# Patient Record
Sex: Male | Born: 1960 | Race: Black or African American | Hispanic: No | Marital: Single | Smoking: Never smoker
Health system: Southern US, Community
[De-identification: ages and names within clinical notes are randomized; demographics above are authoritative.]

## PROBLEM LIST (undated history)

## (undated) DIAGNOSIS — M549 Dorsalgia, unspecified: Secondary | ICD-10-CM

## (undated) DIAGNOSIS — I10 Essential (primary) hypertension: Secondary | ICD-10-CM

## (undated) DIAGNOSIS — N400 Enlarged prostate without lower urinary tract symptoms: Secondary | ICD-10-CM

---

## 2016-10-24 ENCOUNTER — Emergency Department (HOSPITAL_COMMUNITY)
Admission: EM | Admit: 2016-10-24 | Discharge: 2016-10-25 | Disposition: A | Discharge status: Home / Self Care | Payer: Self-pay | Attending: Emergency Medicine | Admitting: Emergency Medicine

## 2016-10-24 ENCOUNTER — Emergency Department (HOSPITAL_COMMUNITY): Payer: Self-pay

## 2016-10-24 ENCOUNTER — Encounter (HOSPITAL_COMMUNITY): Payer: Self-pay

## 2016-10-24 DIAGNOSIS — I10 Essential (primary) hypertension: Secondary | ICD-10-CM | POA: Insufficient documentation

## 2016-10-24 DIAGNOSIS — F4329 Adjustment disorder with other symptoms: Secondary | ICD-10-CM

## 2016-10-24 DIAGNOSIS — F1120 Opioid dependence, uncomplicated: Secondary | ICD-10-CM | POA: Diagnosis present

## 2016-10-24 DIAGNOSIS — Z79899 Other long term (current) drug therapy: Secondary | ICD-10-CM | POA: Insufficient documentation

## 2016-10-24 DIAGNOSIS — F29 Unspecified psychosis not due to a substance or known physiological condition: Secondary | ICD-10-CM

## 2016-10-24 HISTORY — DX: Essential (primary) hypertension: I10

## 2016-10-24 HISTORY — DX: Dorsalgia, unspecified: M54.9

## 2016-10-24 HISTORY — DX: Benign prostatic hyperplasia without lower urinary tract symptoms: N40.0

## 2016-10-24 LAB — CBC WITH DIFFERENTIAL/PLATELET
BASOS ABS: 0 10*3/uL (ref 0.0–0.1)
BASOS PCT: 1 %
EOS ABS: 0.2 10*3/uL (ref 0.0–0.7)
EOS PCT: 3 %
HCT: 36 % — ABNORMAL LOW (ref 39.0–52.0)
Hemoglobin: 11.7 g/dL — ABNORMAL LOW (ref 13.0–17.0)
LYMPHS PCT: 34 %
Lymphs Abs: 2.3 10*3/uL (ref 0.7–4.0)
MCH: 26.8 pg (ref 26.0–34.0)
MCHC: 32.5 g/dL (ref 30.0–36.0)
MCV: 82.6 fL (ref 78.0–100.0)
Monocytes Absolute: 0.6 10*3/uL (ref 0.1–1.0)
Monocytes Relative: 9 %
Neutro Abs: 3.6 10*3/uL (ref 1.7–7.7)
Neutrophils Relative %: 53 %
PLATELETS: 202 10*3/uL (ref 150–400)
RBC: 4.36 MIL/uL (ref 4.22–5.81)
RDW: 13.7 % (ref 11.5–15.5)
WBC: 6.7 10*3/uL (ref 4.0–10.5)

## 2016-10-24 LAB — RAPID URINE DRUG SCREEN, HOSP PERFORMED
Amphetamines: NOT DETECTED
BARBITURATES: NOT DETECTED
Benzodiazepines: POSITIVE — AB
Cocaine: NOT DETECTED
OPIATES: POSITIVE — AB
Tetrahydrocannabinol: NOT DETECTED

## 2016-10-24 LAB — URINALYSIS, ROUTINE W REFLEX MICROSCOPIC
Bilirubin Urine: NEGATIVE
Glucose, UA: NEGATIVE mg/dL
HGB URINE DIPSTICK: NEGATIVE
KETONES UR: NEGATIVE mg/dL
Leukocytes, UA: NEGATIVE
Nitrite: NEGATIVE
PROTEIN: NEGATIVE mg/dL
Specific Gravity, Urine: 1.013 (ref 1.005–1.030)
pH: 6 (ref 5.0–8.0)

## 2016-10-24 LAB — COMPREHENSIVE METABOLIC PANEL
ALK PHOS: 85 U/L (ref 38–126)
ALT: 25 U/L (ref 17–63)
AST: 19 U/L (ref 15–41)
Albumin: 3.6 g/dL (ref 3.5–5.0)
Anion gap: 7 (ref 5–15)
BUN: 20 mg/dL (ref 6–20)
CALCIUM: 8.9 mg/dL (ref 8.9–10.3)
CO2: 28 mmol/L (ref 22–32)
CREATININE: 1.06 mg/dL (ref 0.61–1.24)
Chloride: 104 mmol/L (ref 101–111)
Glucose, Bld: 96 mg/dL (ref 65–99)
Potassium: 4.1 mmol/L (ref 3.5–5.1)
Sodium: 139 mmol/L (ref 135–145)
Total Bilirubin: 0.3 mg/dL (ref 0.3–1.2)
Total Protein: 6.7 g/dL (ref 6.5–8.1)

## 2016-10-24 LAB — ETHANOL

## 2016-10-24 MED ORDER — LORAZEPAM 1 MG PO TABS: 1.0000 mg | ORAL_TABLET | Freq: Three times a day (TID) | ORAL | Status: DC | PRN

## 2016-10-24 MED ORDER — ACETAMINOPHEN 325 MG PO TABS: 650.0000 mg | ORAL_TABLET | ORAL | Status: DC | PRN

## 2016-10-24 NOTE — ED Triage Notes (Signed)
States police they have encountered pt 3 times today crying and taking off clothing in public.  Pt states he is tired but no SI or HI.  States he going to take care of mother in [place].  Police called mother states pt is going to [place]. Pt confused.

## 2016-10-24 NOTE — ED Notes (Signed)
Pt refused blood draw.  Pt not IVC.

## 2016-10-24 NOTE — ED Provider Notes (Signed)
[facility]-EMERGENCY DEPT Provider Note   CSN: [id number] Arrival date & time: 10/24/16  2003     History   Chief Complaint Chief Complaint  Patient presents with  . Depression   Level V caveat due to psychiatric disorder. HPI Tracy Conner is a 56 y.o. male.  HPI Patient brought in by police. Has been seen by police 3 times a day. 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 Had been crying and taking his clothes off in public. Patient states he is just tired. States he has been driving slowly to get up there. Police have discussed with his mother who patient had initially told he was going to go take care of in [place]. Story has changed somewhat. Patient states he has a history of hypertension and some chronic pain. Denies substance abuse. Denies psychiatric history. States he does have pain meds take as he needs them. He has prescriptions for Percocet tens and Xanax with the patient. States he has been drinking some alcohol also. Somewhat difficult to get history from. Past Medical History:  Diagnosis Date  . Back pain   . Hypertension   . Prostate enlargement     There are no active problems to display for this patient.   History reviewed. No pertinent surgical history.     Home Medications    Prior to Admission medications   Medication Sig Start Date End Date Taking? Authorizing Provider  ALPRAZolam Prudy Feeler(XANAX) 1 MG tablet Take 2 mg by mouth 2 (two) times daily.   Yes Historical Provider, MD  oxyCODONE-acetaminophen (PERCOCET) 10-325 MG tablet Take 1 tablet by mouth every 6 (six) hours as needed for pain.   Yes Historical Provider, MD    Family History History reviewed. No pertinent family history.  Social History Social History  Substance Use Topics  . Smoking status: Never Smoker  . Smokeless tobacco: Never Used  . Alcohol use Yes     Allergies   Toradol [ketorolac tromethamine]   Review of Systems Review of Systems    Unable to perform ROS: Psychiatric disorder  Musculoskeletal: Negative for back pain.  Psychiatric/Behavioral: Negative for behavioral problems.     Physical Exam Updated Vital Signs BP 126/83 (BP Location: Left Arm)   Pulse 68   Temp 98.3 F (36.8 C) (Oral)   Resp 18   Ht 5\' 8"  (1.727 m)   Wt 174 lb 6.4 oz (79.1 kg)   SpO2 94%   BMI 26.52 kg/m   Physical Exam  Constitutional: He appears well-developed.  HENT:  Head: Normocephalic.  Eyes: Pupils are equal, round, and reactive to light.  Cardiovascular: Normal rate.   Pulmonary/Chest: Effort normal.  Abdominal: Soft.  Musculoskeletal: He exhibits no edema.  Neurological: He is alert.  Patient's speech is slurred. He is somewhat confused. Moving all extremities. Pupils reactive.  Skin: Skin is warm.  Psychiatric: He has a normal mood and affect.     ED Treatments / Results  Labs (all labs ordered are listed, but only abnormal results are displayed) Labs Reviewed  RAPID URINE DRUG SCREEN, HOSP PERFORMED - Abnormal; Notable for the following:       Result Value   Opiates POSITIVE (*)    Benzodiazepines POSITIVE (*)    All other components within normal limits  CBC WITH DIFFERENTIAL/PLATELET - Abnormal; Notable for the following:    Hemoglobin 11.7 (*)    HCT 36.0 (*)    All other components within normal  limits  COMPREHENSIVE METABOLIC PANEL  ETHANOL  URINALYSIS, ROUTINE W REFLEX MICROSCOPIC    EKG  EKG Interpretation None       Radiology Ct Head Wo Contrast  Result Date: 10/24/2016 CLINICAL DATA:  56 year old male with altered mental status. EXAM: CT HEAD WITHOUT CONTRAST TECHNIQUE: Contiguous axial images were obtained from the base of the skull through the vertex without intravenous contrast. COMPARISON:  None. FINDINGS: Brain: The ventricles and sulci appropriate size for patient's age. Minimal periventricular and deep white matter chronic microvascular ischemic changes noted. There is no acute  intracranial hemorrhage. No mass effect or midline shift noted. Vascular: No hyperdense vessel or unexpected calcification. Skull: Normal. Negative for fracture or focal lesion. Sinuses/Orbits: No acute finding. Other: None IMPRESSION: No acute intracranial pathology. Electronically Signed   By: Elgie Collard M.D.   On: 10/24/2016 22:37    Procedures Procedures (including critical care time)  Medications Ordered in ED Medications  acetaminophen (TYLENOL) tablet 650 mg (not administered)  LORazepam (ATIVAN) tablet 1 mg (not administered)     Initial Impression / Assessment and Plan / ED Course  I have reviewed the triage vital signs and the nursing notes.  Pertinent labs & imaging results that were available during my care of the patient were reviewed by me and considered in my medical decision making (see chart for details).     Patient brought in by police. Has been seen 3 different times today by police taking close off in public. Labs overall reassuring. Appears to medically cleared at this time. Positive for opiates and benzos which he is prescribed. Medically cleare  to be seen by TTS. Final Clinical Impressions(s) / ED Diagnoses   Final diagnoses:  Psychosis, unspecified psychosis type    New Prescriptions New Prescriptions   No medications on file     Benjiman Core, MD 10/24/16 2333

## 2016-10-25 ENCOUNTER — Encounter (HOSPITAL_COMMUNITY): Payer: Self-pay | Admitting: Registered Nurse

## 2016-10-25 DIAGNOSIS — F1120 Opioid dependence, uncomplicated: Secondary | ICD-10-CM

## 2016-10-25 DIAGNOSIS — F4329 Adjustment disorder with other symptoms: Secondary | ICD-10-CM

## 2016-10-25 NOTE — ED Notes (Signed)
Pt. Transferred to [facility] from ED to room 35 after screening for contraband. Report to include Situation, Background, Assessment and Recommendations from RN. Pt. Oriented to unit including Q15 minute rounds as well as the security cameras for their protection. Patient is alert and oriented, warm and dry in no acute distress. Patient denies SI, HI, and AVH. Pt. Encouraged to let me know if needs arise. 

## 2016-10-25 NOTE — ED Notes (Signed)
Hourly rounding reveals patient sleeping in room. No complaints, stable, in no acute distress. Q15 minute rounds and monitoring via Security Cameras to continue. 

## 2016-10-25 NOTE — ED Notes (Signed)
Snack and beverage given. 

## 2016-10-25 NOTE — Consult Note (Signed)
  Patient reports that when taking his medication did not know that the Xanax was in with his pain medication and was taken accidentally.  States that he no longer takes the Xanax related to having a reaction to it and assume that is what happened yesterday.  States that he is willing to have the Xanax destroyed so that it doesn't happen again.  Patient was on his way from [place] to [place].  Patient denies suicidal/homicidal ideation, psychosis, and paranoia.  Patient will be discharged to follow up with his primary psychiatrist.

## 2016-10-25 NOTE — BH Assessment (Signed)
Tele Assessment Note   Tracy Conner is an 56 y.o. male [facility]. Pt is somnolent and did not participate in this assessment. Pt denied SI and HI prior to going back to sleep. Per Dr. Rubin PayorPickering: Patient brought in by police. Has been seen by police 3 times a day. 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 Had been crying and taking his clothes off in public. Patient states he is just tired. States he has been driving slowly to get up there. Police have discussed with his mother who patient had initially told he was going to go take care of in [place]. Story has changed somewhat. Patient states he has a history of hypertension and some chronic pain. Denies substance abuse. Denies psychiatric history. States he does have pain meds take as he needs them. He has prescriptions for Percocet tens and Xanax with the patient. States he has been drinking some alcohol also. Somewhat difficult to get history from.  Diagnosis: Adjustment Disorder  Past Medical History:  Past Medical History:  Diagnosis Date  . Back pain   . Hypertension   . Prostate enlargement     History reviewed. No pertinent surgical history.  Family History: History reviewed. No pertinent family history.  Social History:  reports that he has never smoked. He has never used smokeless tobacco. He reports that he drinks alcohol. He reports that he does not use drugs.  Additional Social History:  Alcohol / Drug Use History of alcohol / drug use?:  (unable to assess)  CIWA: CIWA-Ar BP: 126/83 Pulse Rate: 68 COWS:    PATIENT STRENGTHS: (choose at least two) Supportive family/friends  Allergies:  Allergies  Allergen Reactions  . Toradol [Ketorolac Tromethamine]     Home Medications:  (Not in a hospital admission)  OB/GYN Status:  No LMP for male patient.  General Assessment Data Location of Assessment: [facility] ED TTS Assessment: In system Is this a Tele or  Face-to-Face Assessment?: Face-to-Face Is this an Initial Assessment or a Re-assessment for this encounter?: Initial Assessment Marital status: Single Living Arrangements:  (unable to assess) Can pt return to current living arrangement?:  (unable to assess ) Admission Status: Voluntary Is patient capable of signing voluntary admission?: Yes Referral Source: Self/Family/Friend Insurance type: Medicare      Crisis Care Plan Living Arrangements:  (unable to assess) Name of Psychiatrist: Unable to assess Name of Therapist: Unable to assess   Education Status Is patient currently in school?:  (Unable to assess )  Risk to self with the past 6 months Suicidal Ideation: No Has patient been a risk to self within the past 6 months prior to admission? : No Suicidal Intent: No Has patient had any suicidal intent within the past 6 months prior to admission? : No Is patient at risk for suicide?: No Suicidal Plan?: No Has patient had any suicidal plan within the past 6 months prior to admission? : No Access to Means: No What has been your use of drugs/alcohol within the last 12 months?: Unable to assess  Previous Attempts/Gestures:  (Unable to assess) How many times?:  (Unable to assess) Other Self Harm Risks:  (Unable to assess) Triggers for Past Attempts: Other (Comment) (Unable to assess) Intentional Self Injurious Behavior:  (Unable to assess ) Family Suicide History: Unable to assess Recent stressful life event(s):  (Unable to assess) Persecutory voices/beliefs?:  (Unable to assess) Depression:  (Unable to assess) Depression Symptoms:  (Unable  to assess) Substance abuse history and/or treatment for substance abuse?:  (Unable to assess ) Suicide prevention information given to non-admitted patients: Not applicable  Risk to Others within the past 6 months Homicidal Ideation: No Does patient have any lifetime risk of violence toward others beyond the six months prior to admission? :  Unknown Thoughts of Harm to Others: No Current Homicidal Intent: No Current Homicidal Plan: No Access to Homicidal Means: No Identified Victim: N/A History of harm to others?:  (Unable to assess ) Assessment of Violence: None Noted Violent Behavior Description: No violent behaviors observed at this time.  Does patient have access to weapons?:  (unable to assess ) Criminal Charges Pending?:  (unable to assess) Does patient have a court date:  (unable to assess ) Is patient on probation?: Unknown  Psychosis Hallucinations:  (unable to assess ) Delusions:  (unable to assess)  Mental Status Report Appearance/Hygiene: Unable to Assess Eye Contact: Unable to Assess Motor Activity: Unable to assess Speech: Unable to assess Level of Consciousness: Unable to assess Mood:  (unable to assess) Affect: Unable to Assess Anxiety Level:  (unable to assess) Thought Processes: Unable to Assess Judgement: Unable to Assess Orientation: Unable to assess Obsessive Compulsive Thoughts/Behaviors: Unable to Assess  Cognitive Functioning Concentration: Unable to Assess Memory: Unable to Assess IQ:  (unable to assess) Insight: Unable to Assess Impulse Control: Unable to Assess Appetite:  (unable to assess) Weight Loss:  (unable to assess) Weight Gain:  (unable to assess ) Sleep: Unable to Assess  ADLScreening [facility] Assessment Services) Patient's cognitive ability adequate to safely complete daily activities?: Yes Patient able to express need for assistance with ADLs?: Yes Independently performs ADLs?: Yes (appropriate for developmental age)  Prior Inpatient Therapy Prior Inpatient Therapy:  (unable to assess)  Prior Outpatient Therapy Prior Outpatient Therapy:  (unable to assess) Does patient have an ACCT team?: Unknown Does patient have Intensive In-House Services?  : No Does patient have [facility] services? : Unknown Does patient have P4CC services?: Unknown  ADL Screening (condition at  time of admission) Patient's cognitive ability adequate to safely complete daily activities?: Yes Patient able to express need for assistance with ADLs?: Yes Independently performs ADLs?: Yes (appropriate for developmental age)       Abuse/Neglect Assessment (Assessment to be complete while patient is alone) Physical Abuse:  (unable to assess) Verbal Abuse:  (unable to assess) Sexual Abuse:  (unable to assess) Exploitation of patient/patient's resources:  (unable to assess) Self-Neglect:  (unable to assess )     Advance Directives (For Healthcare) Does Patient Have a Medical Advance Directive?: No    Additional Information 1:1 In Past 12 Months?: No CIRT Risk: No Elopement Risk: No Does patient have medical clearance?: Yes     Disposition:  Disposition Initial Assessment Completed for this Encounter: Yes Disposition of Patient: Other dispositions Other disposition(s): Other (Comment) (AM Psych eval )  Tracy Conner S 10/25/2016 12:07 AM

## 2016-10-25 NOTE — [facility] Suicide Risk Assessment (Cosign Needed)
Suicide Risk Assessment  Discharge Assessment   [facility]   Principal Problem: Adjustment disorder with emotional disturbance Discharge Diagnoses:  Patient Active Problem List   Diagnosis Date Noted  . Adjustment disorder with emotional disturbance [F43.29] 10/25/2016  . Opiate dependence, continuous (HCC) [F11.20] 10/25/2016    Total Time spent with patient: 30 minutes  Musculoskeletal: Strength & Muscle Tone: within normal limits Gait & Station: normal Patient leans: N/A  Psychiatric Specialty Exam:   Blood pressure (!) 155/79, pulse 68, temperature 98.4 F (36.9 C), temperature source Oral, resp. rate 16, height 5\' 8"  (1.727 m), weight 79.1 kg (174 lb 6.4 oz), SpO2 99 %.Body mass index is 26.52 kg/m.  General Appearance: Fairly Groomed  Patent attorneyye Contact::  Good  Speech:  Clear and Coherent and Normal Rate409  Volume:  Normal  Mood:  Appropriate   Affect:  Appropriate  Thought Process:  Coherent and Goal Directed  Orientation:  Full (Time, Place, and Person)  Thought Content:  WDL  Suicidal Thoughts:  No  Homicidal Thoughts:  No  Memory:  Immediate;   Good Recent;   Good Remote;   Good  Judgement:  Intact  Insight:  Present  Psychomotor Activity:  Normal  Concentration:  Good  Recall:  Good  Fund of Knowledge:Good  Language: Good  Akathisia:  No  Handed:  Right  AIMS (if indicated):     Assets:  Communication Skills Desire for Improvement Financial Resources/Insurance Housing Social Support Transportation  Sleep:     Cognition: WNL  ADL's:  Intact   Mental Status Per Nursing Assessment::   On Admission:   Adjustment distorter with emotional disturbance  Demographic Factors:  Male  Loss Factors: NA  Historical Factors: NA  Risk Reduction Factors:   Sense of responsibility to family, Positive social support and Positive therapeutic relationship  Continued Clinical Symptoms:  Previous Psychiatric Diagnoses and  Treatments  Cognitive Features That Contribute To Risk:  None    Suicide Risk:  Minimal: No identifiable suicidal ideation.  Patients presenting with no risk factors but with morbid ruminations; may be classified as minimal risk based on the severity of the depressive symptoms    Plan Of Care/Follow-up recommendations:  Activity:  As tolerated Diet:  Heart Healty  Rankin, Shuvon, NP 10/25/2016, 11:06 AM

## 2018-07-20 IMAGING — CT CT HEAD W/O CM
3 of 4 series · 14 of 47 positions shown, 16 images · non-contrast
Comparison: None.

CLINICAL DATA: 55-year-old male with altered mental status.

EXAM:
CT HEAD WITHOUT CONTRAST
TECHNIQUE: Contiguous axial images were obtained from the base of the skull
through the vertex without intravenous contrast.

[Series 2: head w/o · axial · non-contrast · 0.48mm/px · z∈[+1116,+1246]mm · 8 of 32 slices shown, 10 images]
[im 3/32  brain]
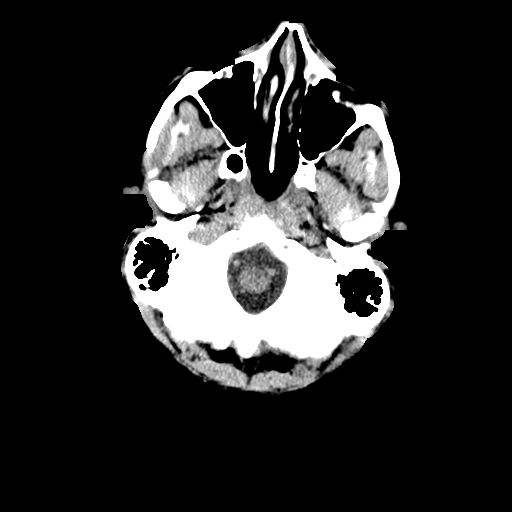
[im 3/32  bone]
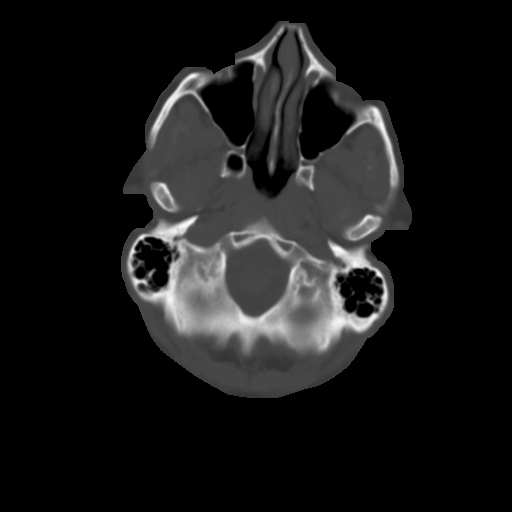
[im 7/32  brain]
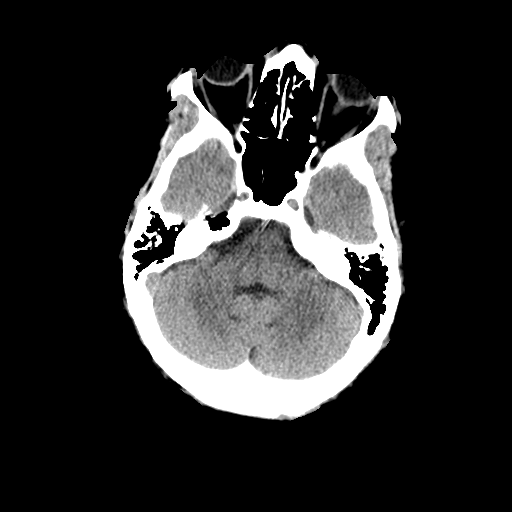
[im 12/32  brain]
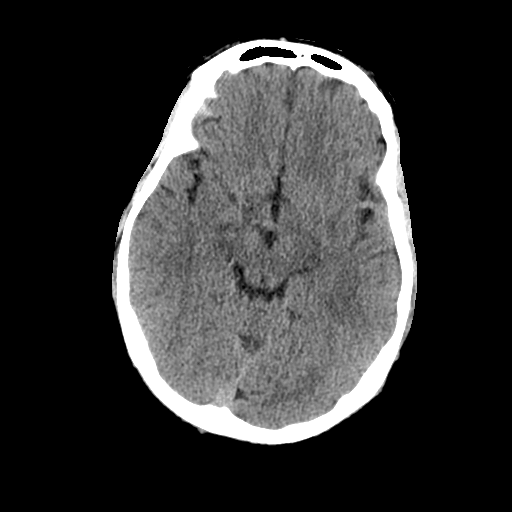
[im 14/32  brain]
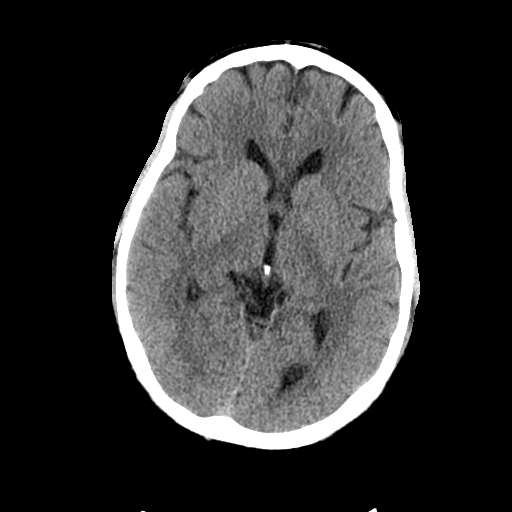
[im 18/32  brain]
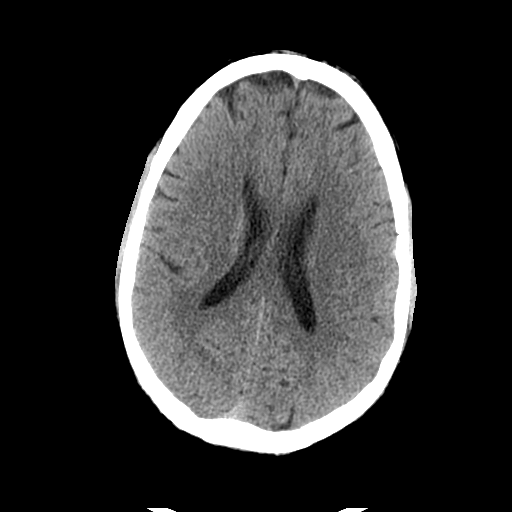
[im 18/32  bone]
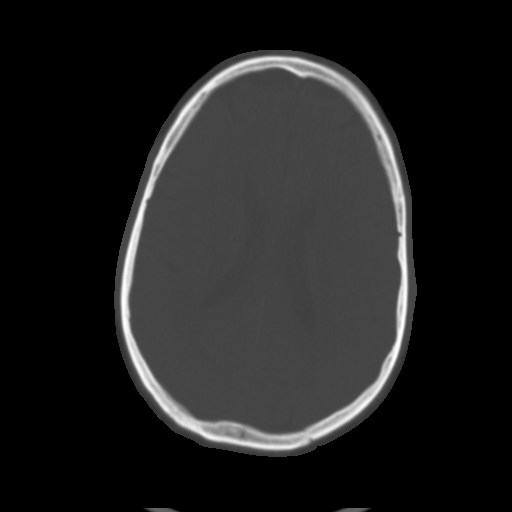
[im 20/32  brain]
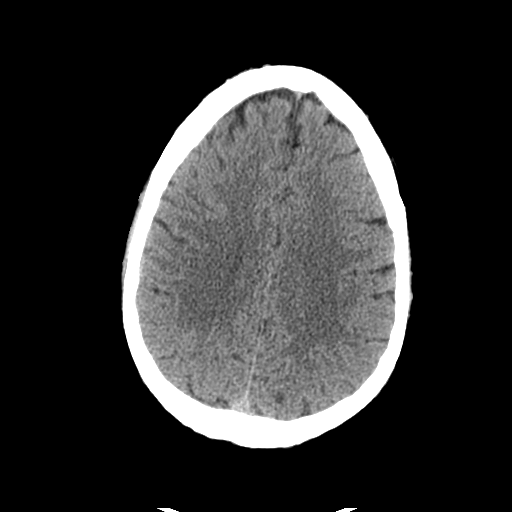
[im 25/32  brain]
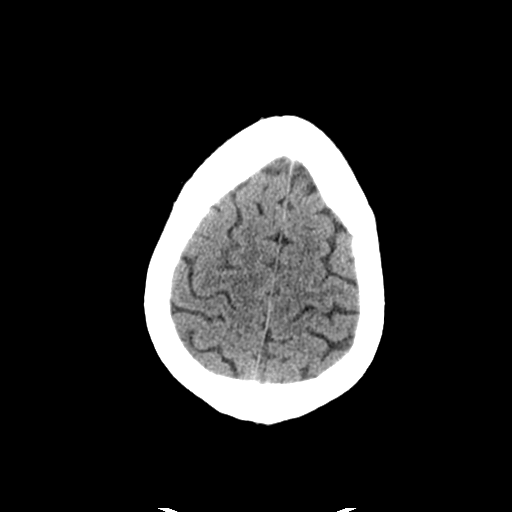
[im 29/32  brain]
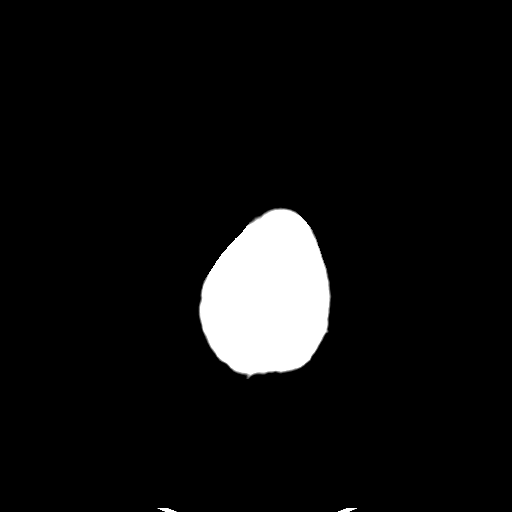

[Series 4: coronal · coronal · 0.31mm/px · 3 of 77 slices shown]
[im 26/77  brain]
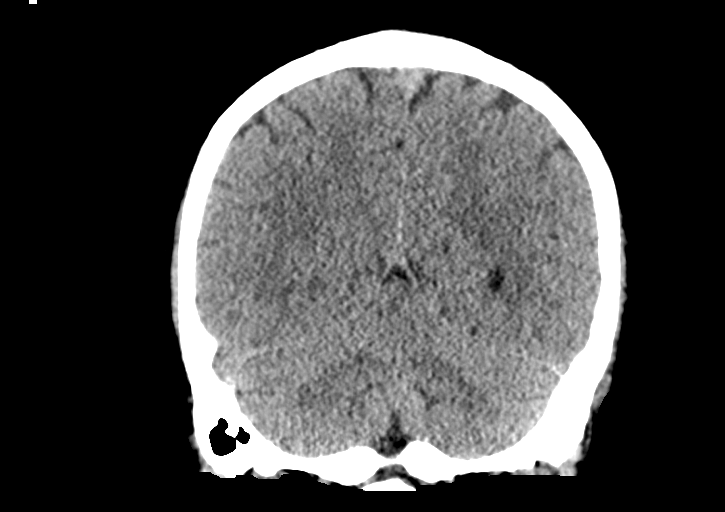
[im 34/77  brain]
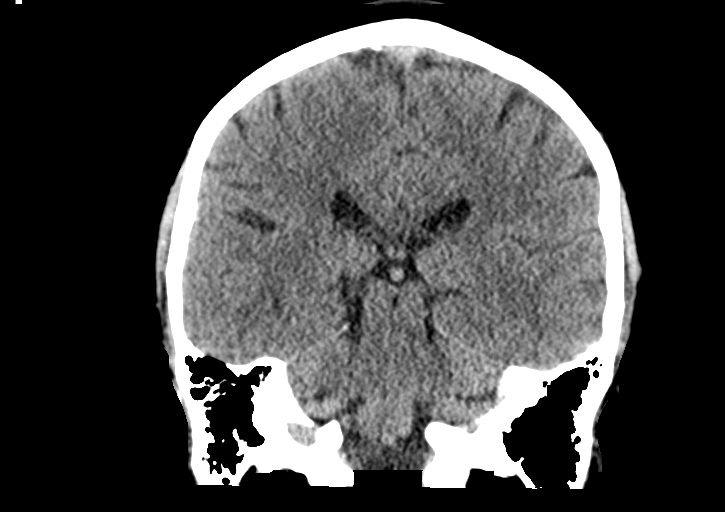
[im 43/77  brain]
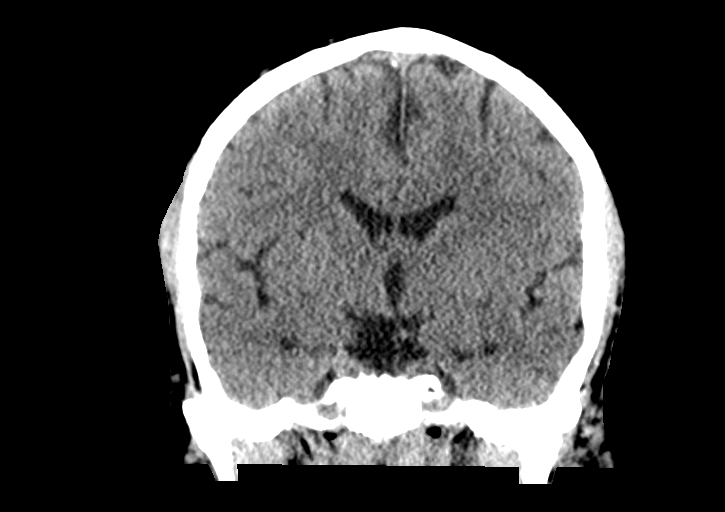

[Series 5: sagittal · sagittal · 0.31mm/px · 3 of 77 slices shown]
[im 26/77  brain]
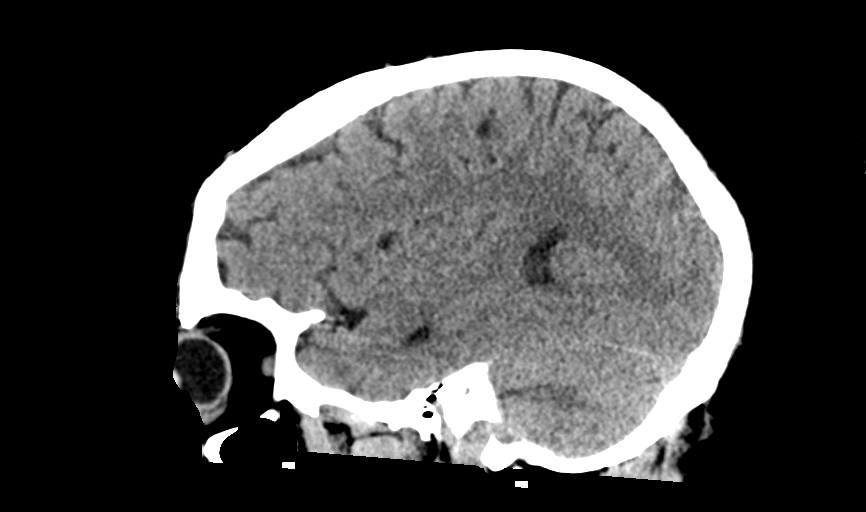
[im 39/77  brain]
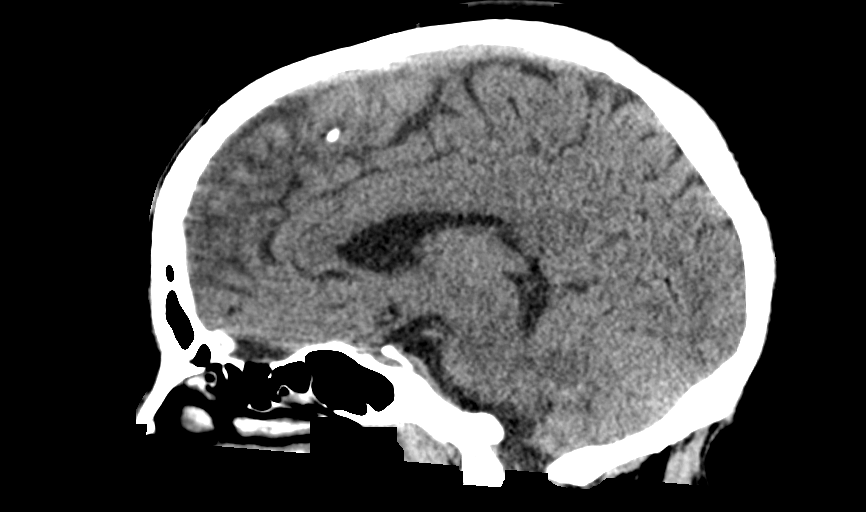
[im 51/77  brain]
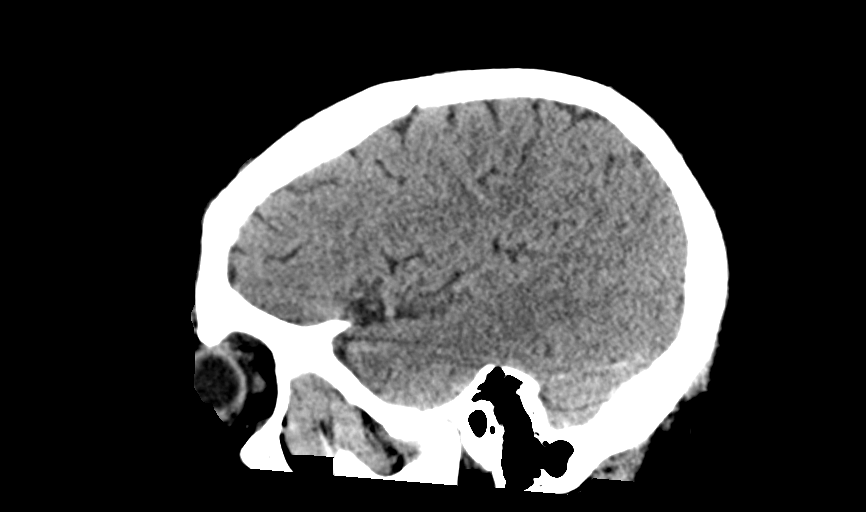

[14 of 47 positions shown; findings below may reference images not displayed]

FINDINGS: Brain: The ventricles and sulci appropriate size for patient's age.
Minimal periventricular and deep white matter chronic microvascular
ischemic changes noted. There is no acute intracranial hemorrhage.
No mass effect or midline shift noted.

Vascular: No hyperdense vessel or unexpected calcification.

Skull: Normal. Negative for fracture or focal lesion.

Sinuses/Orbits: No acute finding.

Other: None
IMPRESSION: No acute intracranial pathology.

## 2019-01-09 DEATH — deceased
